# Patient Record
Sex: Male | Born: 1950 | Race: White | Hispanic: No | Marital: Single | Smoking: Current every day smoker
Health system: Western US, Community
[De-identification: ages and names within clinical notes are randomized; demographics above are authoritative.]

---

## 2018-10-09 ENCOUNTER — Inpatient Hospital Stay
Admit: 2018-10-09 | Discharge: 2018-10-09 | Disposition: A | Discharge status: Home / Self Care | Payer: Commercial Managed Care - PPO | Attending: Family

## 2018-10-09 ENCOUNTER — Emergency Department: Admit: 2018-10-09 | Payer: Commercial Managed Care - PPO

## 2018-10-09 DIAGNOSIS — R531 Weakness: Secondary | ICD-10-CM

## 2018-10-09 LAB — CBC WITH AUTO DIFFERENTIAL
Basophils %: 1 % (ref 0–2)
Basophils, Absolute: 0.1 10*3/ÂµL (ref 0.0–0.2)
Eosinophils %: 3 % (ref 0–7)
Eosinophils, Absolute: 0.3 10*3/ÂµL (ref 0.0–0.7)
HCT: 50 % (ref 42.0–54.0)
Hemoglobin: 16.5 g/dL (ref 12.0–18.0)
Lymphocytes %: 28 % (ref 25–45)
Lymphocytes, Absolute: 2.7 10*3/ÂµL (ref 1.1–4.3)
MCH: 29.9 pg (ref 27.0–34.0)
MCHC: 32.9 g/dL (ref 32.0–36.0)
MCV: 90.9 fL (ref 81.0–99.0)
MPV: 7.7 fL (ref 7.4–10.4)
Monocytes %: 7 % (ref 0–12)
Monocytes, Absolute: 0.6 10*3/ÂµL (ref 0.0–1.2)
Neutrophils %: 62 % (ref 35–70)
Neutrophils, Absolute: 6 10*3/ÂµL (ref 1.6–7.3)
Platelet Count: 248 10*3/ÂµL (ref 150–400)
RBC: 5.51 10*6/ÂµL (ref 4.70–6.10)
RDW: 13.6 % (ref 11.5–14.5)
WBC: 9.6 10*3/ÂµL (ref 4.8–10.8)

## 2018-10-09 LAB — COMPREHENSIVE METABOLIC PANEL
ALT - Alanine Aminotransferase: 11 IU/L (ref 7–52)
AST - Aspartate Aminotransferase: 15 IU/L (ref 10–50)
Albumin/Globulin Ratio: 1.8 (ref >0.9)
Albumin: 4.4 g/dL (ref 3.5–5.0)
Alkaline Phosphatase: 91 IU/L (ref 34–104)
Anion Gap: 6 mmol/L (ref 4–13)
BUN: 16 mg/dL (ref 6–23)
Bilirubin Total: 0.6 mg/dL (ref 0.3–1.2)
CO2 - Carbon Dioxide: 30 mmol/L (ref 21–31)
Calcium: 9.9 mg/dL (ref 8.6–10.3)
Chloride: 103 mmol/L (ref 98–111)
Creatinine: 1.01 mg/dL (ref 0.65–1.30)
GFR Estimate Male: >60 mL/min/{1.73_m2} (ref >=60)
Globulin: 2.5 g/dL (ref 2.2–3.7)
Glucose: 110 mg/dL — ABNORMAL HIGH (ref 80–99)
Potassium: 4.3 mmol/L (ref 3.5–5.1)
Protein Total: 6.9 g/dL (ref 6.0–8.0)
Sodium: 139 mmol/L (ref 135–143)

## 2018-10-09 LAB — TROPONIN I: Troponin I: <0.03 ng/mL (ref <=0.04)

## 2018-10-09 MED ORDER — sodium chloride (NS) 0.9% bolus 500 mL
Freq: Once | INTRAVENOUS | Status: AC
Start: 2018-10-09 — End: 2018-10-09
  Administered 2018-10-09 (×2): via INTRAVENOUS

## 2018-10-09 MED FILL — SODIUM CHLORIDE 0.9 % INTRAVENOUS SOLUTION: 500.0000 mL | INTRAVENOUS | Qty: 1000

## 2018-10-09 NOTE — ED Notes (Signed)
Bed: 119-01  Expected date:   Expected time:   Means of arrival:   Comments:  Ready-

## 2018-10-09 NOTE — ED Provider Notes (Signed)
[facility] EMERGENCY DEPARTMENT ENCOUNTER    History and Physical     Name: Samuel Dawson  DOB: 01-30-1951 67 y.o.  MRN: [id number]  CSN: [id number]    HISTORY:     CHIEF COMPLAINT    Chief Complaint   Patient presents with   . Fatigue   . Cough       HPI    Samuel Dawson is a 67 y.o. male history of asthma, hypertension presents to the emergency department with reports of weakness.  The patient is felt weak for about 3 weeks.  He is visiting family here from [place].  He is actually had it back next week.  He on arrival states he has weakness.  Shortness of breath is a symptom, though not predominant.  No chest pain.  He has no other diaphoresis, no syncope reported.  The patient has no abdominal pain.  He does have some decreased oral intake and he does relate some mild weight loss over the last several months.    He is concerned because he had some nodules diagnosed in a imaging in [place].  He is concerned about these nodules.    The patient is afebrile, not tachycardic.  Normal blood pressure  PAST MEDICAL HISTORY    Past Medical History:   Diagnosis Date   . Asthma    . Hypertension        SURGICAL HISTORY    Past Surgical History:   Procedure Laterality Date   . neck fusion  2018   . SPINAL FUSION  2009       CURRENT MEDICATIONS    Prior to Admission medications    Medication Sig Start Date End Date Taking? Authorizing Provider   lisinopril (PRINIVIL,ZESTRIL) 40 MG tablet Take 40 mg by mouth daily.   Yes Historical Provider, MD       ALLERGIES    Patient has no known allergies.    FAMILY HISTORY    No family history on file.    SOCIAL HISTORY    Social History     Tobacco Use   . Smoking status: Current Every Day Smoker     Packs/day: 1.00     Years: 50.00     Pack years: 50.00     Types: Cigarettes   Substance Use Topics   . Alcohol use: Not Currently   . Drug use: Yes     Types: Marijuana     Comment: occasionally     Other Social History Comments:       REVIEW OF SYSTEMS:   10 point review  of systems negative except for HPI    PHYSICAL EXAM:   VITAL SIGNS:    Initial Vitals [10/09/18 1051]   BP 131/83   Pulse 78   Resp 18   Temp 36.3 ?C (97.4 ?F)   SpO2 98 %     Constitutional: Alert.  Nontoxic.  Mildly ill in appearance   HENT:  Normocephalic, Atraumatic, Bilateral external ears normal, Oropharynx moist, No oral exudates, Nose normal. Neck- Normal range of motion, No tenderness, Supple, No stridor.   Eyes:  PERRL, EOMI, Conjunctiva normal, No discharge.   Respiratory: Lung sounds are clear Cardiovascular: No tachycardia, no murmurs.  GI: Abdomen soft nontender.   GU: No CVA tenderness   Musculoskeletal:  Intact distal pulses, No edema, No tenderness, No cyanosis, No clubbing. Good range of motion in all major joints. No tenderness to palpation or major deformities noted. Back- No tenderness.  Integument: No pallor, no cyanosis lymphatic:  No lymphadenopathy noted.   Neurologic:  Alert & oriented x 3, Normal motor function, Normal sensory function, No focal deficits noted.   Psychiatric: Anxious appearance    DATA:     EKG    EKG obtained nonischemic    LABS    CBC unremarkable  CMP unremarkable  RADIOLOGY/PROCEDURES    X-ray Chest Pa And Lateral    Result Date: 10/09/2018  XR CHEST PA AND LATERAL 10/09/2018 12:29 PM PROVIDED CLINICAL INDICATIONS: weakness, near syncope COMPARISON: None. FINDINGS: Clear lungs. No pleural effusion or pneumothorax. Flattening of the hemidiaphragms. Normal cardiomediastinal contour. No acute osseous abnormality.     IMPRESSION: Clear lungs. Flattening of the hemidiaphragms, compatible with chronic obstructive pulmonary physiology.     PLAN:     ED COURSE & MEDICAL DECISION MAKING    67 year old male presents to the emergency department with reports of weakness.  Weakness ongoing for about 3 weeks.  The patient is due to return to [place] in about a week.  The patient has felt weak intermittently, with some associated mild sweats at night.  Most concerned about some  nodules that were previously diagnosed on an x-ray.    Sepsis is considered though the patient has no vital signs do not reflect this.  Predominantly the patient has respiratory symptoms, did obtain a chest x-ray which shows some COPD physiology, but no evidence of pulmonary infiltrates consistent with pneumonia, pulmonary edema, and no acute nodules are identified.    Elect light derangements considered given his decreased oral intake    Saline lock is placed.  Patient is given 1 L of lactated Ringer's.    Labs are obtained, CBC and CMP unremarkable    Patient did receive orthostatic vital signs which were also unremarkable.  He is ambulated, he does well.  I conveyed to him he is likely experiencing some moderate viral illness, he will continue adequate oral intake.  He will return to the emergency department if he develops high fevers, fainting, severe increase in cough or chest pain.  Otherwise he will follow-up with primary care upon return to [place] for further evaluation of these lung nodules.    Patient is discharged in apparently stable condition    FINAL IMPRESSION    1.  Weakness        This document was created with the assistance of voice-to-text technology. Effort has been made to minimize transcription errors. Please allow for homonyms and other similar transcription errors.       Youlanda Mighty, FNP  10/14/18 2035

## 2018-10-09 NOTE — Discharge Instructions (Signed)
Patient Education       The tests today did not show any damage to the heart, pneumonia, or bad problem with the electrolytes.     Fatigue  If you have fatigue, you feel tired all the time and have a lack of energy or a lack of motivation. Fatigue may make it difficult to start or complete tasks because of exhaustion. In general, occasional or mild fatigue is often a normal response to activity or life. However, long-lasting (chronic) or extreme fatigue may be a symptom of a medical condition.  Follow these instructions at home:  General instructions  ? Watch your fatigue for any changes.  ? Go to bed and get up at the same time every day.  ? Avoid fatigue by pacing yourself during the day and getting enough sleep at night.  ? Maintain a healthy weight.  Medicines  ? Take over-the-counter and prescription medicines only as told by your health care provider.  ? Take a multivitamin, if told by your health care provider.?  ? Do not use herbal or dietary supplements unless they are approved by your health care provider.  Activity    ? Exercise regularly, as told by your health care provider.  ? Use or practice techniques to help you relax, such as yoga, tai chi, meditation, or massage therapy.  Eating and drinking    ? Avoid heavy meals in the evening.  ? Eat a well-balanced diet, which includes lean proteins, whole grains, plenty of fruits and vegetables, and low-fat dairy products.  ? Avoid consuming too much caffeine.  ? Avoid the use of alcohol.  ? Drink enough fluid to keep your urine pale yellow.  Lifestyle  ? Change situations that cause you stress. Try to keep your work and personal schedule in balance.  ? Do not use any products that contain nicotine or tobacco, such as cigarettes and e-cigarettes. If you need help quitting, ask your health care provider.  ? Do not use drugs.  Contact a health care provider if:  ? Your fatigue does not get better.  ? You have a fever.  ? You suddenly lose or gain weight.  ? You  have headaches.  ? You have trouble falling asleep or sleeping through the night.  ? You feel angry, guilty, anxious, or sad.  ? You are unable to have a bowel movement (constipation).  ? Your skin is dry.  ? You have swelling in your legs or another part of your body.  Get help right away if:  ? You feel confused.  ? Your vision is blurry.  ? You feel faint or you pass out.  ? You have a severe headache.  ? You have severe pain in your abdomen, your back, or the area between your waist and hips (pelvis).  ? You have chest pain, shortness of breath, or an irregular or fast heartbeat.  ? You are unable to urinate, or you urinate less than normal.  ? You have abnormal bleeding, such as bleeding from the rectum, vagina, nose, lungs, or nipples.  ? You vomit blood.  ? You have thoughts about hurting yourself or others.  If you ever feel like you may hurt yourself or others, or have thoughts about taking your own life, get help right away. You can go to your nearest emergency department or call:  ? Your local emergency services (911 in the [place]  ? A suicide crisis helpline, such as the National Suicide Prevention Lifeline at  (843)373-5263. This is open 24 hours a day.  Summary  ? If you have fatigue, you feel tired all the time and have a lack of energy or a lack of motivation.  ? Fatigue may make it difficult to start or complete tasks because of exhaustion.  ? Long-lasting (chronic) or extreme fatigue may be a symptom of a medical condition.  ? Exercise regularly, as told by your health care provider.  ? Change situations that cause you stress. Try to keep your work and personal schedule in balance.  This information is not intended to replace advice given to you by your health care provider. Make sure you discuss any questions you have with your health care provider.  Document Released: 10/08/2007 Document Revised: 09/05/2017 Document Reviewed: 09/05/2017  Elsevier Interactive Patient Education ? 2019 [employer]

## 2018-10-09 NOTE — ED Triage Notes (Signed)
Patient visiting from [place], he has been diagnosed with nodules on his lungs, he is to follow up with his PCP in [place]. He is reporting increased cough, one episode of syncope (3 weeks ago), fatigue. Patient leaves to return on Nov. 1

## 2018-10-09 NOTE — ED Notes (Signed)
Orthostatic Vitals:  Lying- p 63 BP 140/88  Sitting- p 65 BP 143/90  Standing- p 68 BP 145/89

## 2019-10-11 IMAGING — CR DX Hand Complete LT
4 series · 4 of 4 positions shown · non-contrast
Comparison: None.

DX Hand Complete LT
INDICATION: Swelling Left proximal interphalangial joint infection.
TECHNIQUE: 4 views of the left hand

[pa]
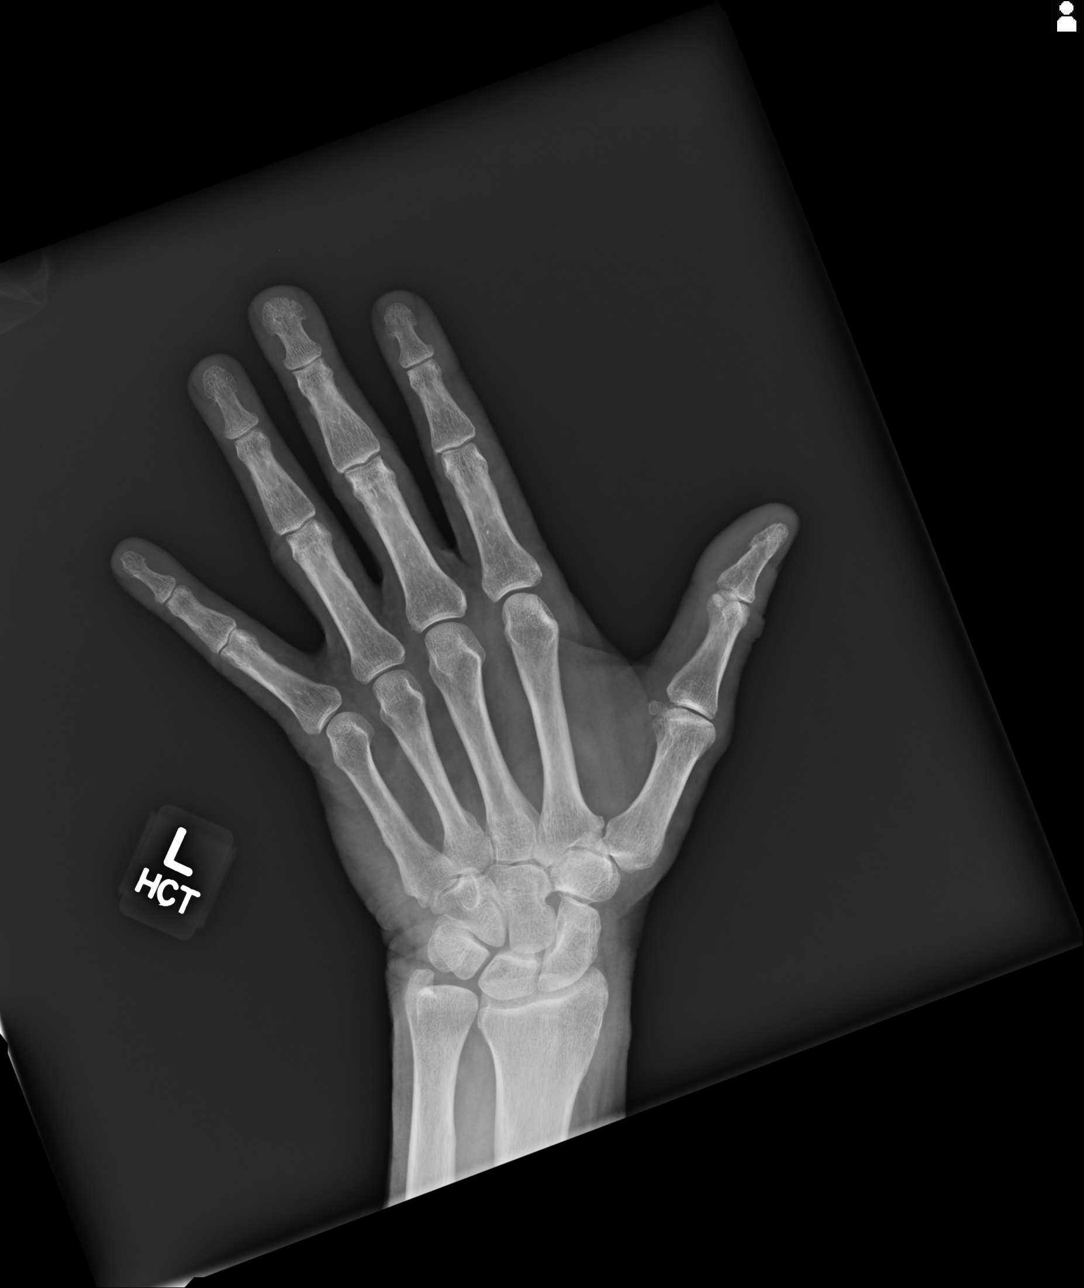

[oblique (1 of 2)]
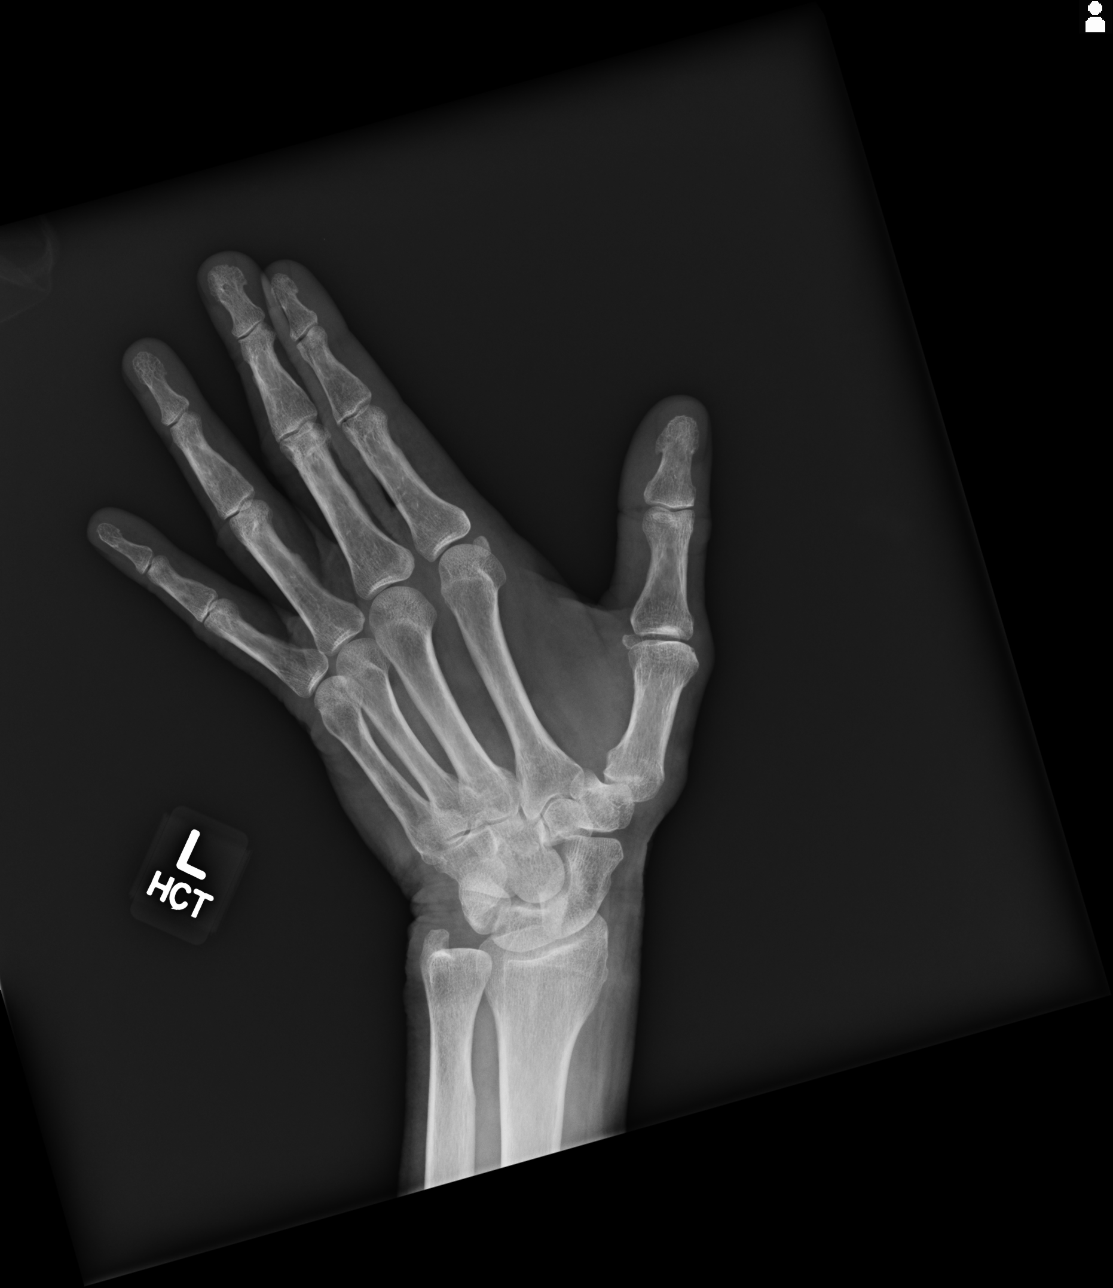

[oblique (2 of 2)]
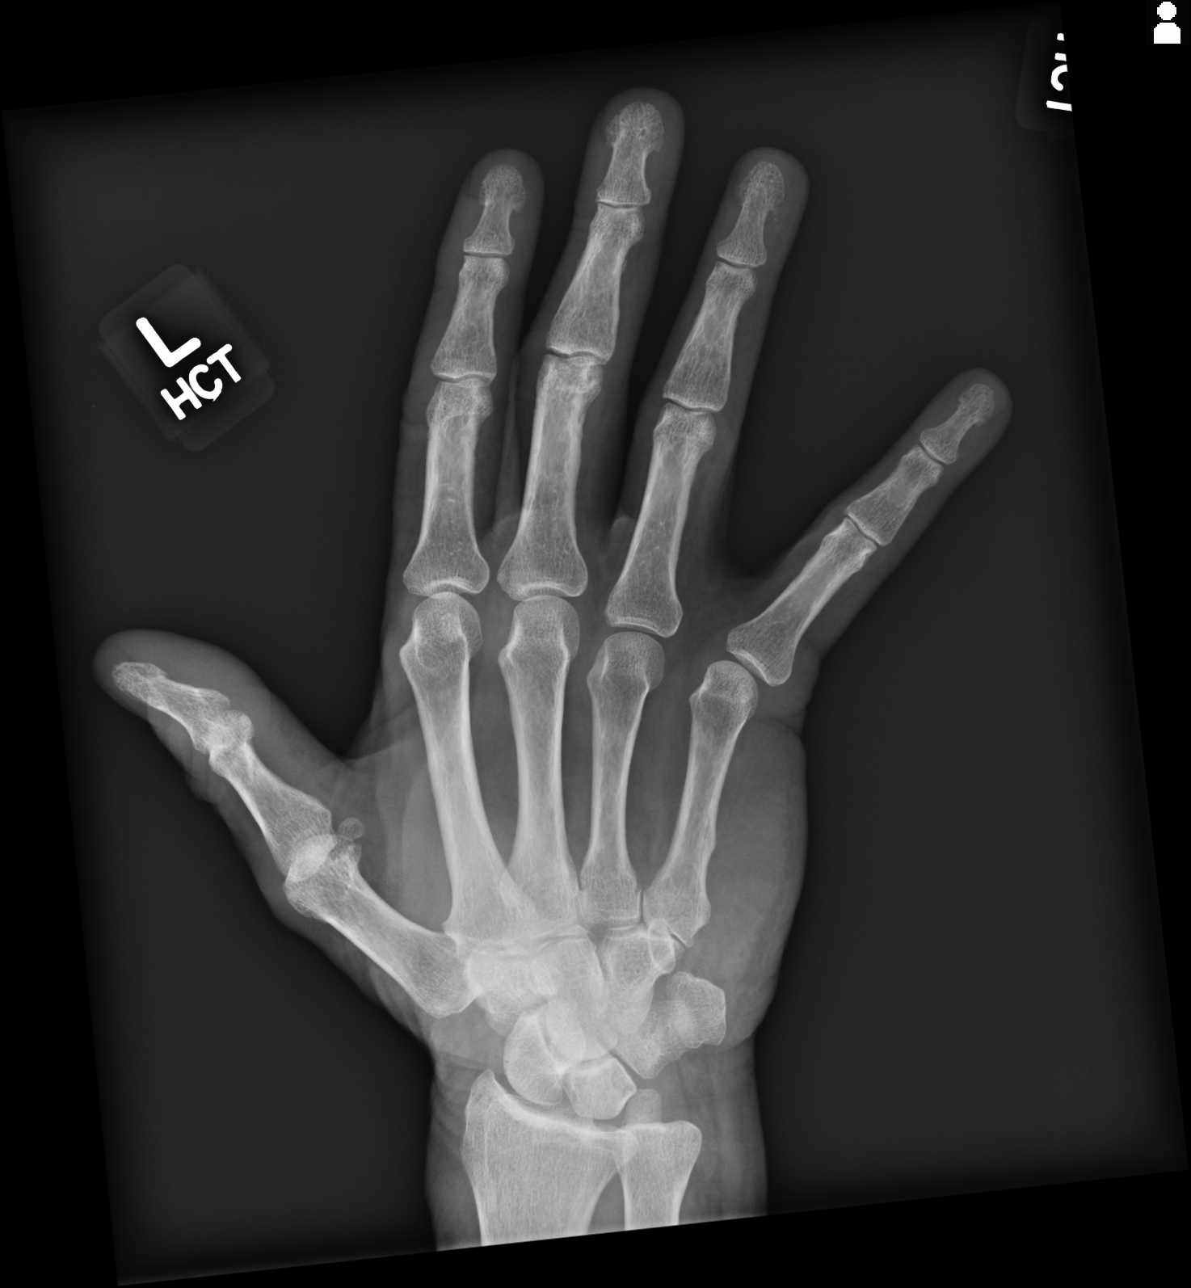

[lat]
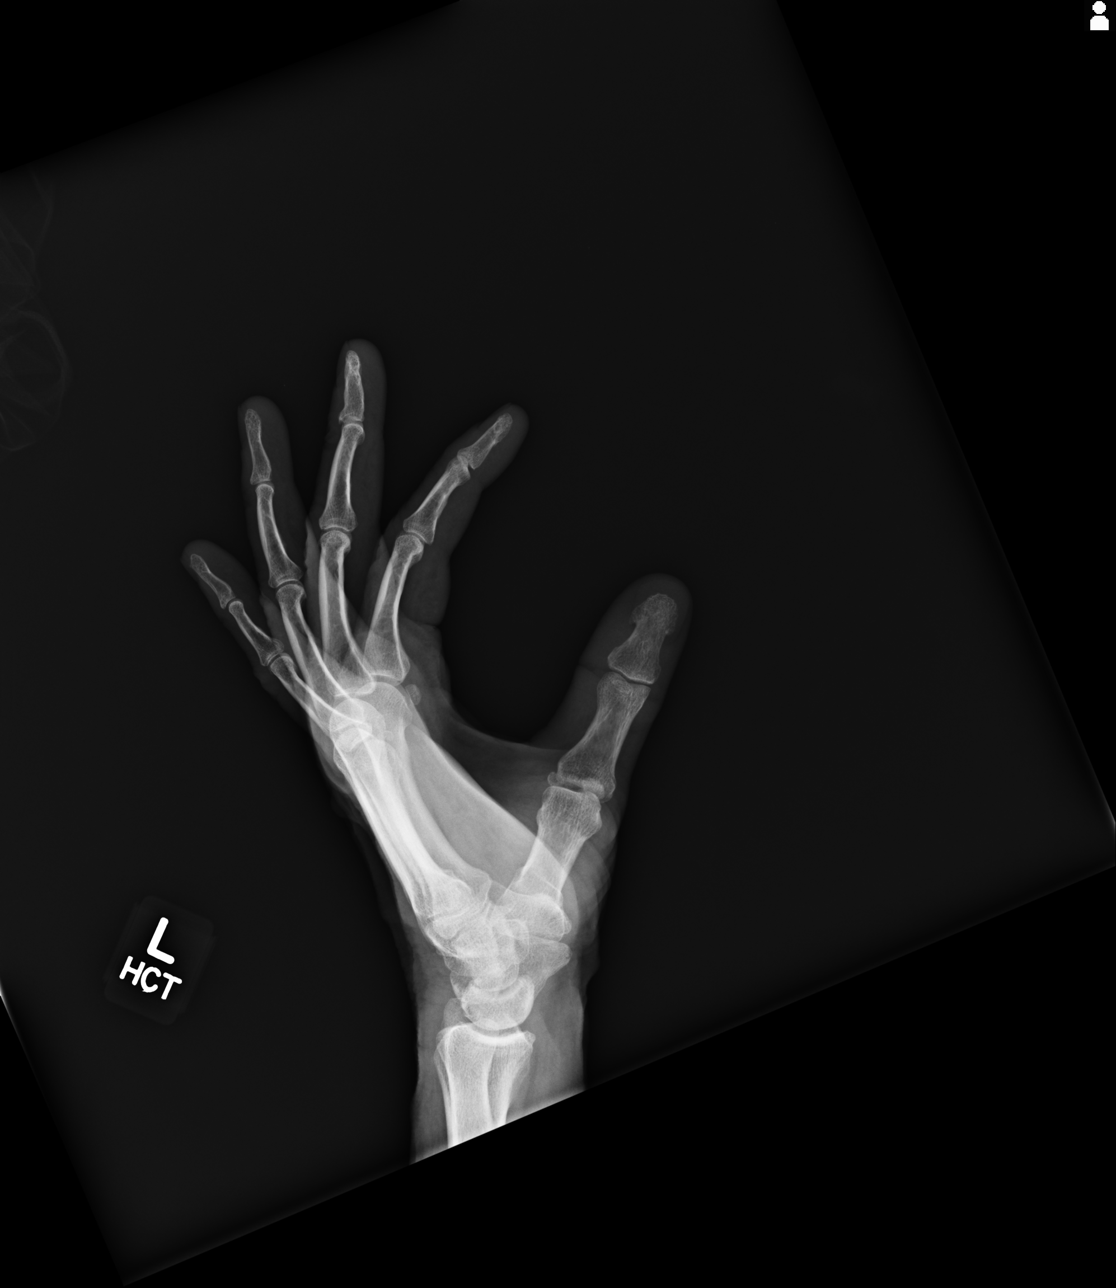

[4 of 4 positions shown; findings below may reference images not displayed]

FINDINGS/IMPRESSION:                                                                      
 No acute fracture or malalignment is identified. There is soft tissue swelling            
 involving the first digit. No conclusive osseous erosions are identified.
